# Patient Record
Sex: Female | Born: 1955 | Race: Black or African American | Hispanic: No | Marital: Married
Health system: Midwestern US, Community
[De-identification: ages and names within clinical notes are randomized; demographics above are authoritative.]

## PROBLEM LIST (undated history)

## (undated) DIAGNOSIS — K589 Irritable bowel syndrome without diarrhea: Secondary | ICD-10-CM

## (undated) DIAGNOSIS — C679 Malignant neoplasm of bladder, unspecified: Secondary | ICD-10-CM

## (undated) DIAGNOSIS — I10 Essential (primary) hypertension: Secondary | ICD-10-CM

## (undated) DIAGNOSIS — Z90710 Acquired absence of both cervix and uterus: Secondary | ICD-10-CM

## (undated) DIAGNOSIS — E785 Hyperlipidemia, unspecified: Secondary | ICD-10-CM

## (undated) DIAGNOSIS — E669 Obesity, unspecified: Secondary | ICD-10-CM

## (undated) HISTORY — DX: Essential (primary) hypertension: I10

## (undated) HISTORY — DX: Acquired absence of both cervix and uterus: Z90.710

## (undated) HISTORY — DX: Obesity, unspecified: E66.9

## (undated) HISTORY — PX: BLADDER REMOVAL: SHX567

## (undated) HISTORY — DX: Irritable bowel syndrome, unspecified: K58.9

## (undated) HISTORY — DX: Hyperlipidemia, unspecified: E78.5

---

## 1998-06-26 ENCOUNTER — Other Ambulatory Visit: Admission: RE | Admit: 1998-06-26 | Discharge: 1998-06-26 | Payer: Self-pay | Admitting: Obstetrics and Gynecology

## 2000-03-08 ENCOUNTER — Encounter: Payer: Self-pay | Admitting: Family Medicine

## 2000-03-08 ENCOUNTER — Encounter: Admission: RE | Admit: 2000-03-08 | Discharge: 2000-03-08 | Payer: Self-pay | Admitting: Family Medicine

## 2000-05-27 ENCOUNTER — Other Ambulatory Visit: Admission: RE | Admit: 2000-05-27 | Discharge: 2000-05-27 | Payer: Self-pay | Admitting: Obstetrics and Gynecology

## 2008-04-02 ENCOUNTER — Ambulatory Visit: Payer: Self-pay | Admitting: Family Medicine

## 2008-04-02 ENCOUNTER — Encounter: Admission: RE | Admit: 2008-04-02 | Discharge: 2008-04-02 | Payer: Self-pay | Admitting: Family Medicine

## 2008-04-08 ENCOUNTER — Ambulatory Visit: Payer: Self-pay | Admitting: Family Medicine

## 2008-08-22 ENCOUNTER — Ambulatory Visit: Payer: Self-pay | Admitting: Family Medicine

## 2008-08-28 ENCOUNTER — Encounter: Payer: Self-pay | Admitting: *Deleted

## 2008-11-11 ENCOUNTER — Ambulatory Visit: Payer: Self-pay | Admitting: Family Medicine

## 2008-12-02 ENCOUNTER — Ambulatory Visit: Payer: Self-pay | Admitting: Family Medicine

## 2008-12-13 ENCOUNTER — Ambulatory Visit: Payer: Self-pay | Admitting: Family Medicine

## 2009-01-23 ENCOUNTER — Encounter: Payer: Self-pay | Admitting: *Deleted

## 2009-05-07 ENCOUNTER — Ambulatory Visit: Payer: Self-pay | Admitting: Family Medicine

## 2009-06-25 ENCOUNTER — Ambulatory Visit: Payer: Self-pay | Admitting: Family Medicine

## 2009-10-28 IMAGING — CR DG CERVICAL SPINE COMPLETE 4+V
6 series · 6 of 6 positions shown · non-contrast
Comparison: None

CLINICAL DATA: Neck pain with left arm pain and bilateral extremity
tingling.

CERVICAL SPINE - COMPLETE 4+ VIEW

[view not recorded (1 of 6)]
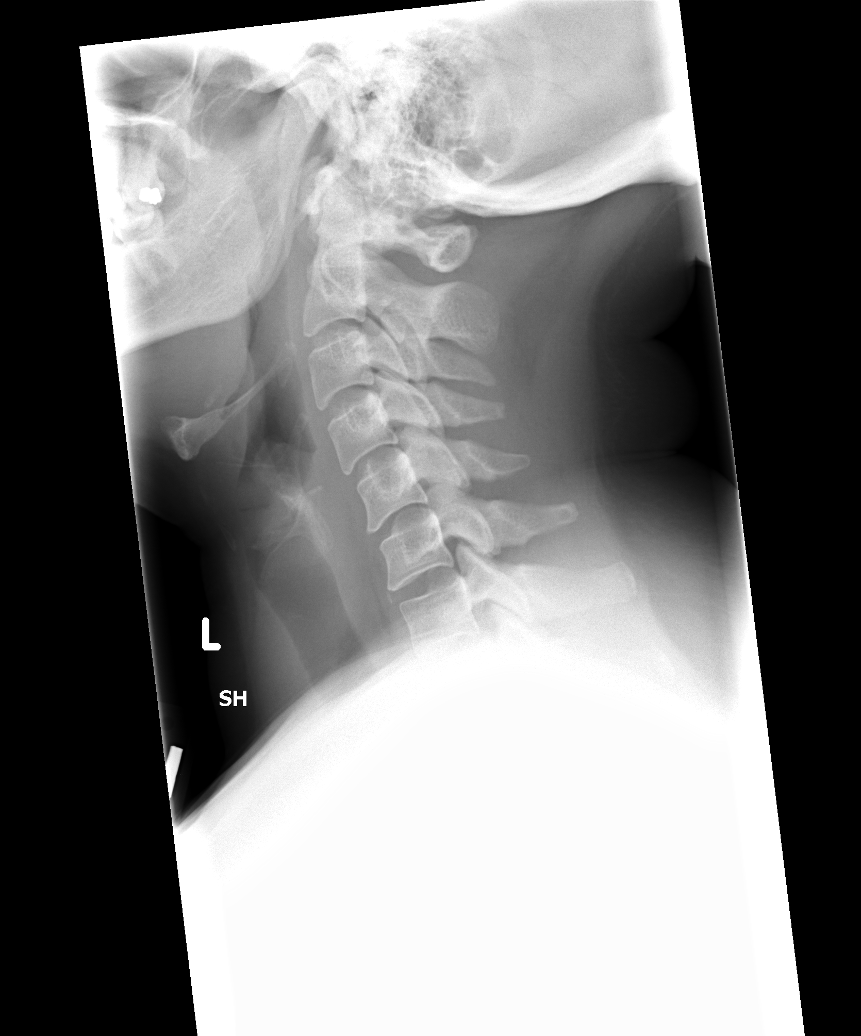

[view not recorded (2 of 6)]
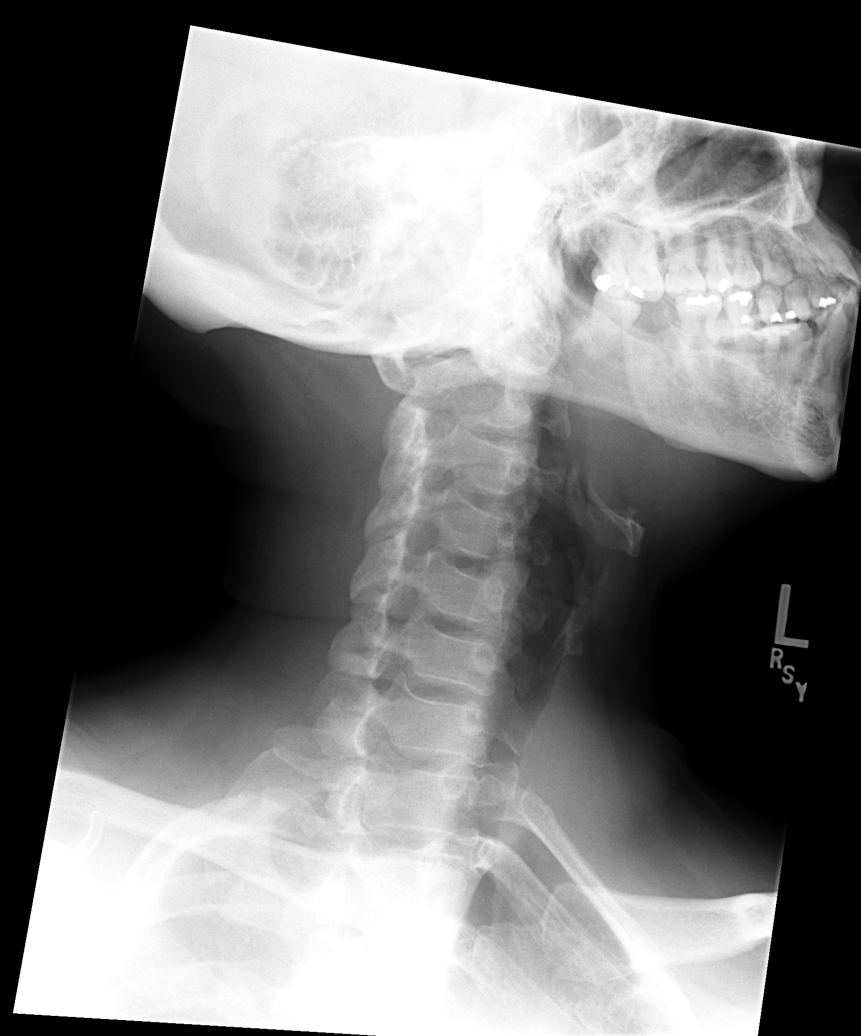

[view not recorded (3 of 6)]
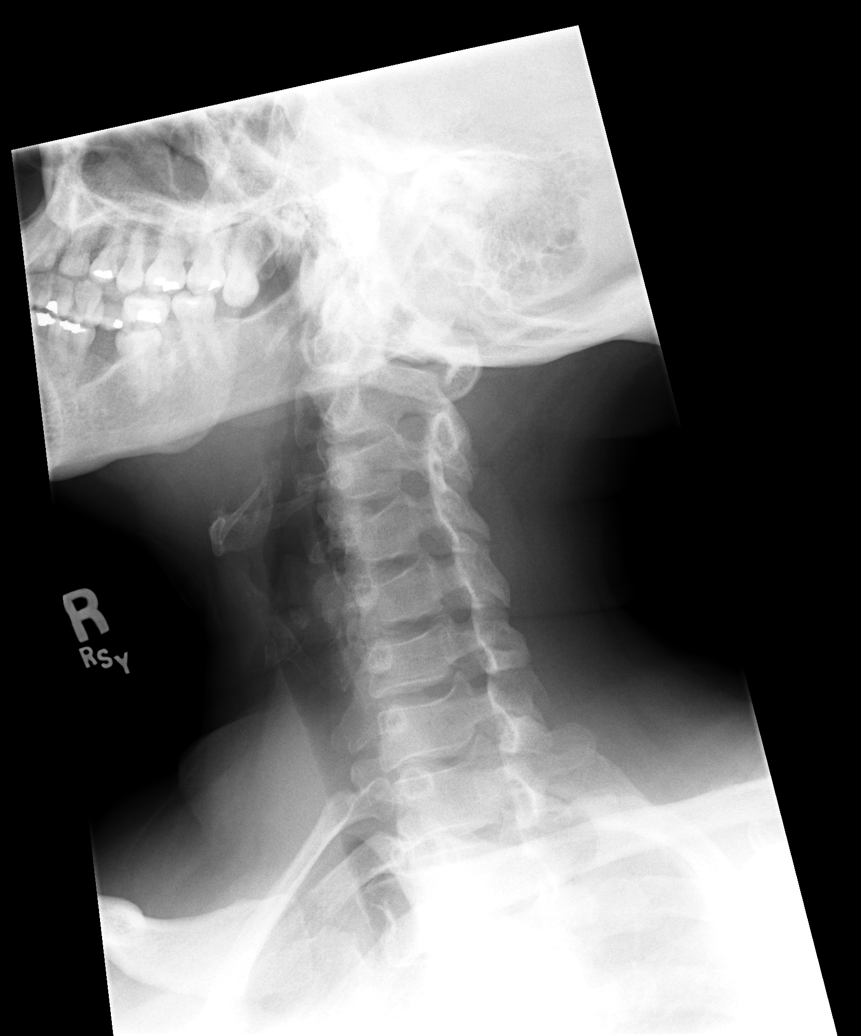

[view not recorded (4 of 6)]
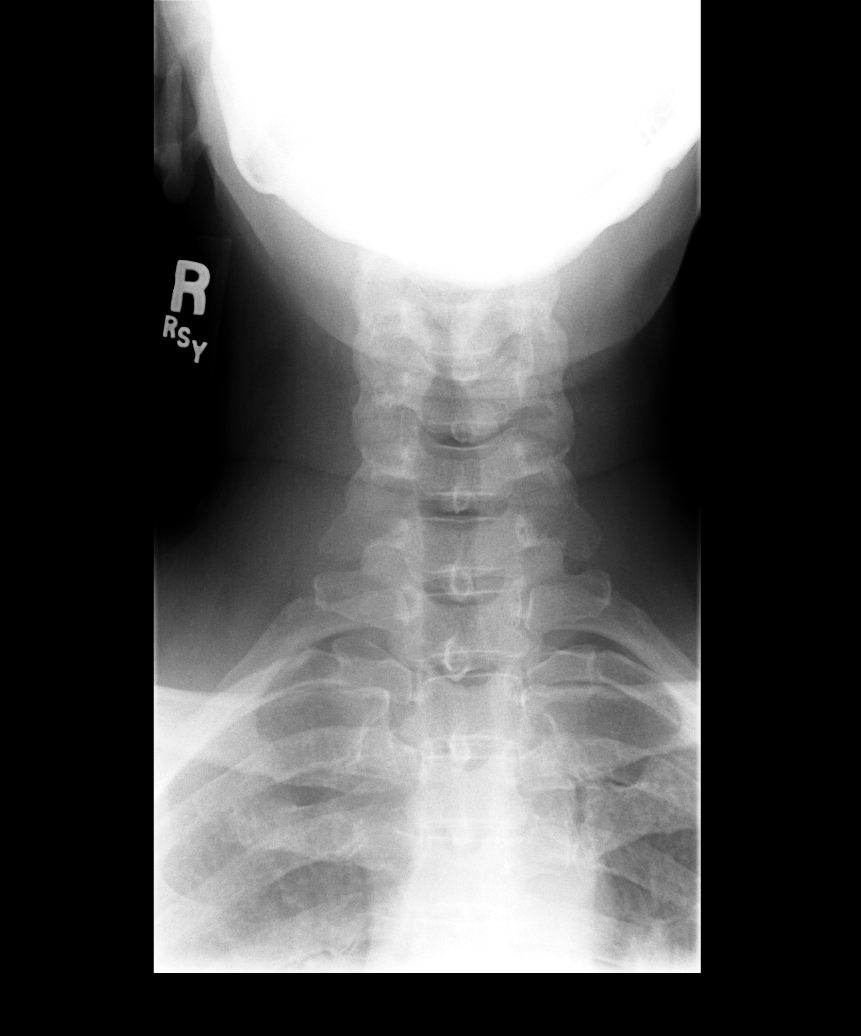

[view not recorded (5 of 6)]
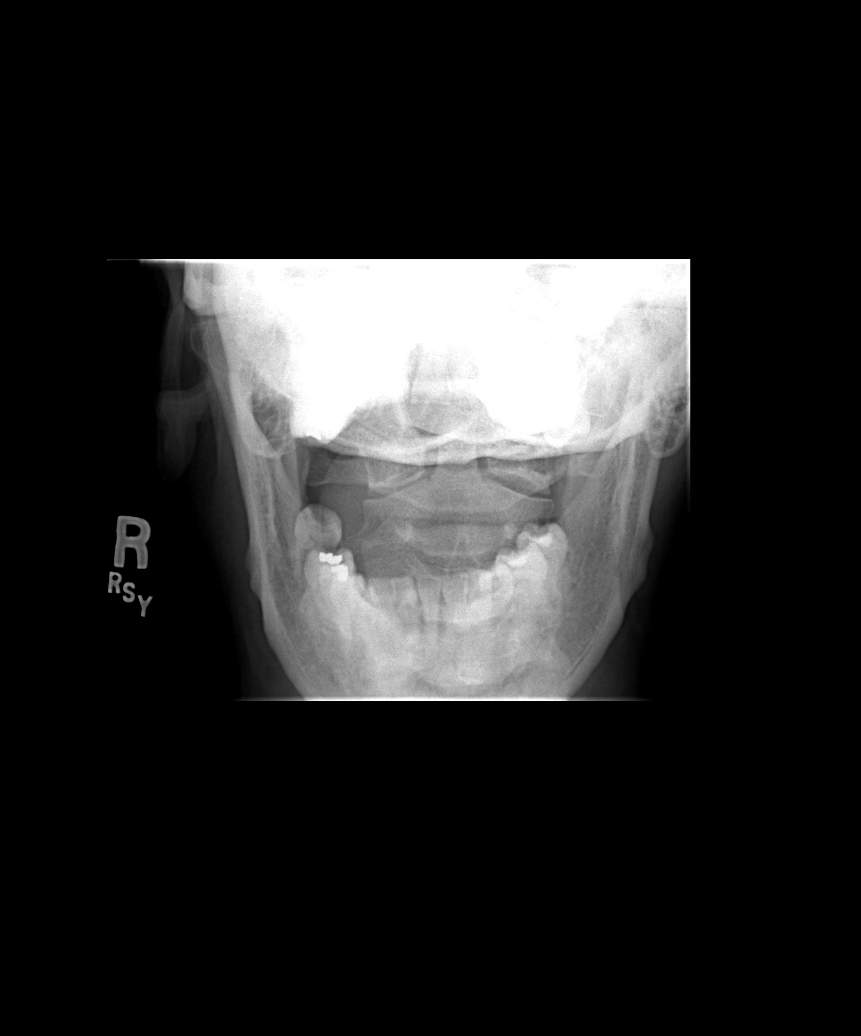

[view not recorded (6 of 6)]
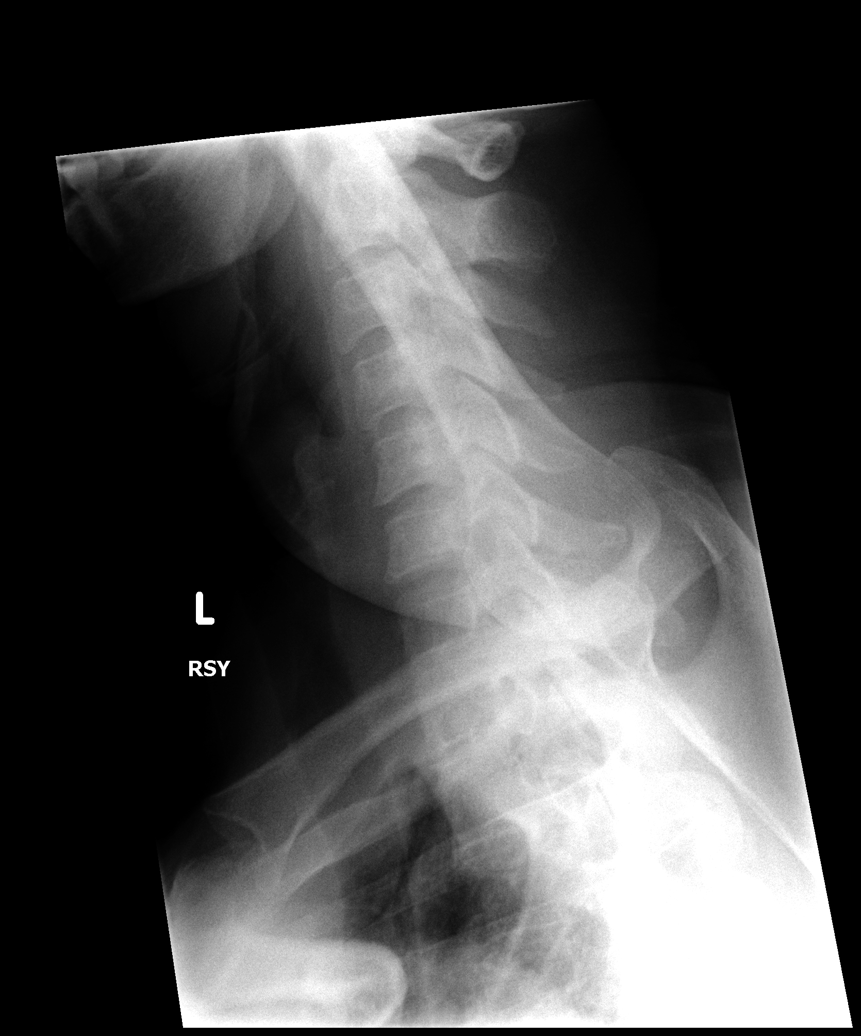

[6 of 6 positions shown; findings below may reference images not displayed]

FINDINGS: The prevertebral soft tissues are normal.  There is mild
straightening of the usual cervical lordosis without focal
angulation or listhesis.  The cervical disc spaces and neural
foramina appear preserved.  The C1-C2 articulation appears normal
in the AP projection.
IMPRESSION: Normal cervical spine radiographs.

## 2009-11-18 ENCOUNTER — Ambulatory Visit: Payer: Self-pay | Admitting: Family Medicine

## 2009-11-19 ENCOUNTER — Emergency Department (HOSPITAL_BASED_OUTPATIENT_CLINIC_OR_DEPARTMENT_OTHER): Admission: EM | Admit: 2009-11-19 | Discharge: 2009-11-20 | Payer: Self-pay | Admitting: Emergency Medicine

## 2009-12-18 ENCOUNTER — Ambulatory Visit: Payer: Self-pay | Admitting: Family Medicine

## 2010-08-23 LAB — BASIC METABOLIC PANEL
BUN: 10 mg/dL (ref 6–23)
CO2: 27 mEq/L (ref 19–32)
Calcium: 9.4 mg/dL (ref 8.4–10.5)
Chloride: 105 mEq/L (ref 96–112)
Creatinine, Ser: 0.7 mg/dL (ref 0.4–1.2)
GFR calc Af Amer: >60 mL/min (ref >60)
GFR calc non Af Amer: >60 mL/min (ref >60)
Glucose, Bld: 122 mg/dL — ABNORMAL HIGH (ref 70–99)
Potassium: 3 mEq/L — ABNORMAL LOW (ref 3.5–5.1)
Sodium: 144 mEq/L (ref 135–145)

## 2010-10-09 LAB — HM MAMMOGRAPHY: HM Mammogram: NEGATIVE

## 2011-01-25 ENCOUNTER — Encounter: Payer: Self-pay | Admitting: Family Medicine

## 2013-10-19 ENCOUNTER — Encounter (HOSPITAL_BASED_OUTPATIENT_CLINIC_OR_DEPARTMENT_OTHER): Payer: Self-pay | Admitting: Emergency Medicine

## 2013-10-19 ENCOUNTER — Emergency Department (HOSPITAL_BASED_OUTPATIENT_CLINIC_OR_DEPARTMENT_OTHER): Payer: BC Managed Care – PPO

## 2013-10-19 ENCOUNTER — Emergency Department (HOSPITAL_BASED_OUTPATIENT_CLINIC_OR_DEPARTMENT_OTHER)
Admission: EM | Admit: 2013-10-19 | Discharge: 2013-10-19 | Disposition: A | Discharge status: Home / Self Care | Payer: BC Managed Care – PPO | Attending: Emergency Medicine | Admitting: Emergency Medicine

## 2013-10-19 DIAGNOSIS — Z90710 Acquired absence of both cervix and uterus: Secondary | ICD-10-CM | POA: Insufficient documentation

## 2013-10-19 DIAGNOSIS — Z8551 Personal history of malignant neoplasm of bladder: Secondary | ICD-10-CM | POA: Insufficient documentation

## 2013-10-19 DIAGNOSIS — E669 Obesity, unspecified: Secondary | ICD-10-CM | POA: Insufficient documentation

## 2013-10-19 DIAGNOSIS — I872 Venous insufficiency (chronic) (peripheral): Secondary | ICD-10-CM | POA: Insufficient documentation

## 2013-10-19 DIAGNOSIS — I10 Essential (primary) hypertension: Secondary | ICD-10-CM | POA: Insufficient documentation

## 2013-10-19 DIAGNOSIS — Z8719 Personal history of other diseases of the digestive system: Secondary | ICD-10-CM | POA: Insufficient documentation

## 2013-10-19 DIAGNOSIS — Z79899 Other long term (current) drug therapy: Secondary | ICD-10-CM | POA: Insufficient documentation

## 2013-10-19 HISTORY — DX: Malignant neoplasm of bladder, unspecified: C67.9

## 2013-10-19 NOTE — ED Notes (Signed)
Pt here to r/u blood clot in (L) leg.

## 2013-10-19 NOTE — Discharge Instructions (Signed)
Your venous duplex was negative for clot. This is likely chronic peripheral venous insufficiency. Raise legs frequently throughout the day, and wear compression stockings. You can also prevent clot by keeping legs moving as we discussed and walking every hour or so on your flight. You can also take aspirin 81mg  the day before, of, and after your flight. If you develop worsened leg swelling, redness, chest pain, or shortness of breath, seek immediate care.

## 2013-10-19 NOTE — ED Provider Notes (Signed)
CSN: [id number]     Arrival date & time 10/19/13  1734 History   First MD Initiated Contact with Patient 10/19/13 1750     Chief Complaint  Patient presents with  . Leg Pain    Patient is a 58 y.o. female presenting with leg pain.  Leg Pain  58 y.o. female with 1 month of intermittent leg swelling and numbness/tingling. She denies leg pain unless she dorsiflexes the leg, at which point she has mild calf dull pain / tightness. Denies dyspnea, chest pain, fever, weakness, or recent immobility. Swelling improves with legs raised overnight. She does have h/o bladder cancer. Her PCP told her to get checked for blood clot in leg prior to her 1.5 hour flight later this week.   Past Medical History  Diagnosis Date  . Hypertension   . Obesity   . Increased serum lipids   . IBS (irritable bowel syndrome)   . H/O: hysterectomy   . Bladder cancer    Past Surgical History  Procedure Laterality Date  . Bladder removal     Family History  Problem Relation Age of Onset  . Hypertension Father   . Hypertension Mother   . Hypertension Brother   . Hypertension Brother   . Diabetes Brother   . Diabetes Brother   . Diabetes Sister   . Hypertension Sister   . Hypertension Sister   . Hypertension Sister    History  Substance Use Topics  . Smoking status: Never Smoker   . Smokeless tobacco: Not on file  . Alcohol Use: Yes     Comment: occasionally   OB History   Grav Para Term Preterm Abortions TAB SAB Ect Mult Living                 Review of Systems  All other systems reviewed and are negative.   Allergies  Codeine  Home Medications   Prior to Admission medications   Medication Sig Start Date End Date Taking? Authorizing Provider  diltiazem (CARDIZEM CD) 240 MG 24 hr capsule Take 240 mg by mouth daily.   Yes Historical Provider, MD  losartan (COZAAR) 100 MG tablet Take 100 mg by mouth daily.   Yes Historical Provider, MD  potassium chloride SA (K-DUR,KLOR-CON) 20 MEQ tablet  Take 40 mEq by mouth 2 (two) times daily.   Yes Historical Provider, MD   BP 127/81  Pulse 78  Temp(Src) 98.1 F (36.7 C) (Oral)  Resp 16  Ht 5\' 4"  (1.626 m)  Wt 220 lb (99.791 kg)  BMI 37.74 kg/m2  SpO2 100% Physical Exam GEN: NAD, pleasant HEENT: Atraumatic, normocephalic, neck supple, EOMI, sclera clear  CV: RRR, no murmurs, rubs, or gallops, 2+ bilateral DP pulses PULM: CTAB, normal effort ABD: Soft, nontender, nondistended, NABS, no organomegaly SKIN: No rash or cyanosis; warm and well-perfused EXTR: No LE edema and left leg not visually larger than right; no erythema. Mild left calf tenderness. Left upper calf 37cm compared to 36cm on right. Left mid-calf 38 cm compared to 36 cm on right. PSYCH: Mood and affect euthymic, normal rate and volume of speech NEURO: Awake, alert, no focal deficits grossly, normal speech   ED Course  Procedures (including critical care time) Labs Review Labs Reviewed - No data to display  Imaging Review US Venous Img Lower Unilateral Left  10/19/2013   CLINICAL DATA:  Left foot swelling for 1 month.  EXAM: Left LOWER EXTREMITY VENOUS DOPPLER ULTRASOUND  TECHNIQUE: Gray-scale sonography with graded compression, as  well as color Doppler and duplex ultrasound were performed to evaluate the lower extremity deep venous systems from the level of the common femoral vein and including the common femoral, femoral, profunda femoral, popliteal and calf veins including the posterior tibial, peroneal and gastrocnemius veins when visible. The superficial great saphenous vein was also interrogated. Spectral Doppler was utilized to evaluate flow at rest and with distal augmentation maneuvers in the common femoral, femoral and popliteal veins.  COMPARISON:  None.  FINDINGS: Common Femoral Vein: No evidence of thrombus. Normal compressibility, respiratory phasicity and response to augmentation.  Saphenofemoral Junction: No evidence of thrombus. Normal compressibility  and flow on color Doppler imaging.  Profunda Femoral Vein: No evidence of thrombus. Normal compressibility and flow on color Doppler imaging.  Femoral Vein: No evidence of thrombus. Normal compressibility, respiratory phasicity and response to augmentation.  Popliteal Vein: No evidence of thrombus. Normal compressibility, respiratory phasicity and response to augmentation.  Calf Veins: No evidence of thrombus. Normal compressibility and flow on color Doppler imaging.  Superficial Great Saphenous Vein: No evidence of thrombus. Normal compressibility and flow on color Doppler imaging.  Venous Reflux:  None.  Other Findings:  None.  IMPRESSION: No evidence of deep venous thrombosis.   Electronically Signed   By: Abelardo Diesel M.D.   On: 10/19/2013 18:52     EKG Interpretation None      MDM   Final diagnoses:  Chronic venous insufficiency   58 y.o. female with h/o bladder cancer here with 1 month of left leg intermittent swelling that improves after keeping legs up overnight. Wells criteria of 1 point. LE venous duplex left negative for DVT. Most likely venous insufficiency. No fever or erythema/induration, making infection unlikely. - Suggested compression stockings. - Walk during flight and take baby aspirin day prior, of, and after flight. - Return precautions reviewed.  Hilton Sinclair, MD PGY-2, [facility]   Hilton Sinclair, MD 10/19/13 [phone]

## 2013-10-21 NOTE — ED Provider Notes (Signed)
I saw and evaluated the patient, reviewed the resident's note and I agree with the findings and plan.   EKG Interpretation None      58 year old female with bilateral leg swelling. Ultrasound negative for DVT. On exam, well appearing, sitting upright in her chair, not distressed, normal respiratory effort, normal perfusion no appreciable edema of either lower extremity, DP pulses intact. Plan outpatient followup.  Clinical Impression: 1. Chronic venous insufficiency       Houston Siren III, MD 10/21/13 0130

## 2015-05-16 IMAGING — US US EXTREM LOW VENOUS*L*
1 series · 13 of 23 positions shown · non-contrast
Comparison: None.

CLINICAL DATA: Left foot swelling for 1 month.



[Series 1: us extrem low venous*left* · 13 of 23 slices shown]
[im 1/23]
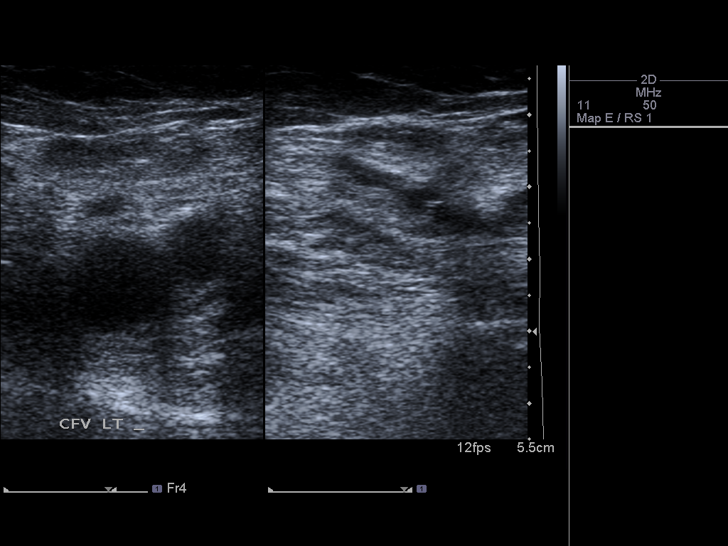
[im 3/23]
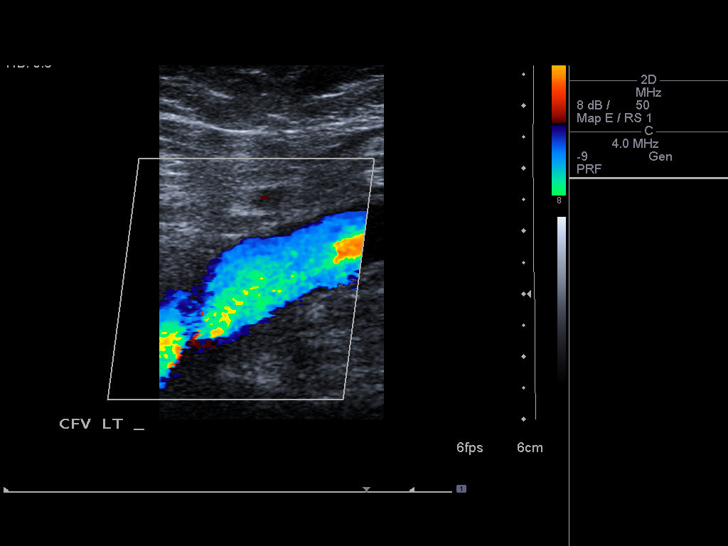
[im 5/23]
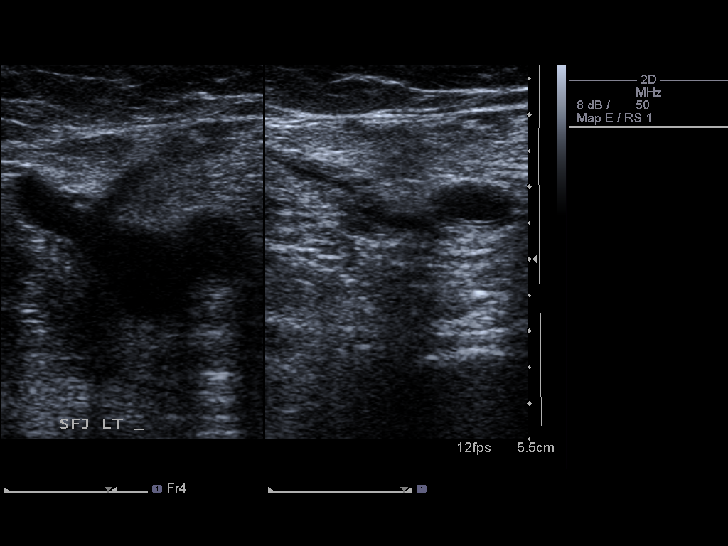
[im 7/23]
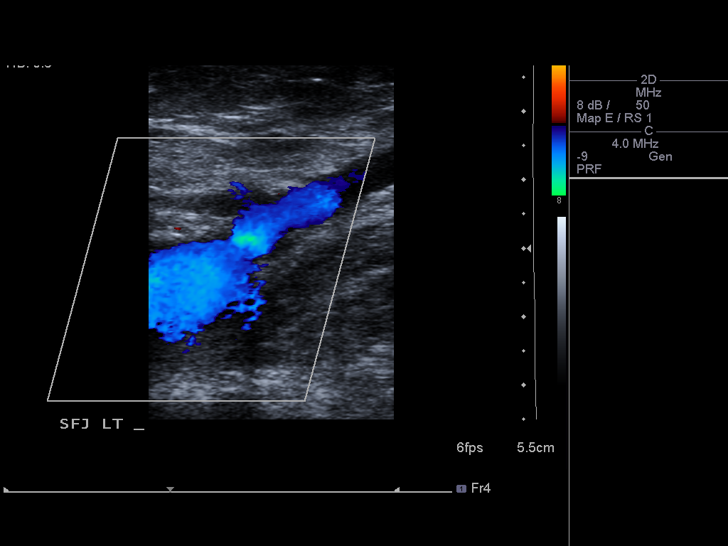
[im 8/23]
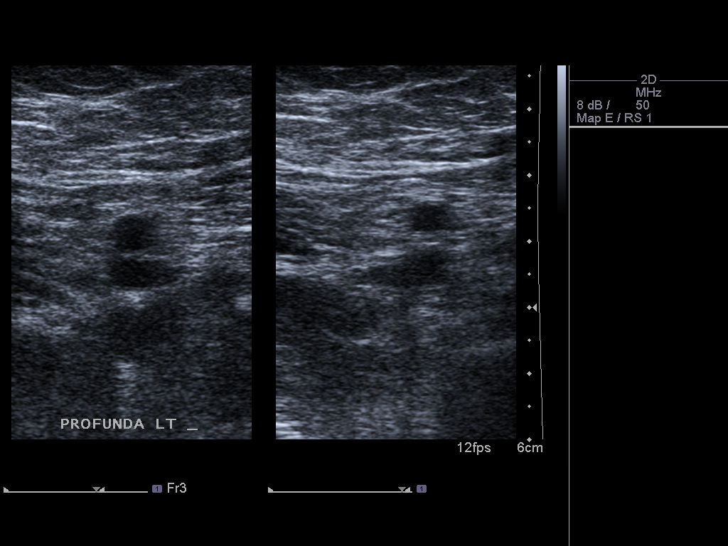
[im 10/23]
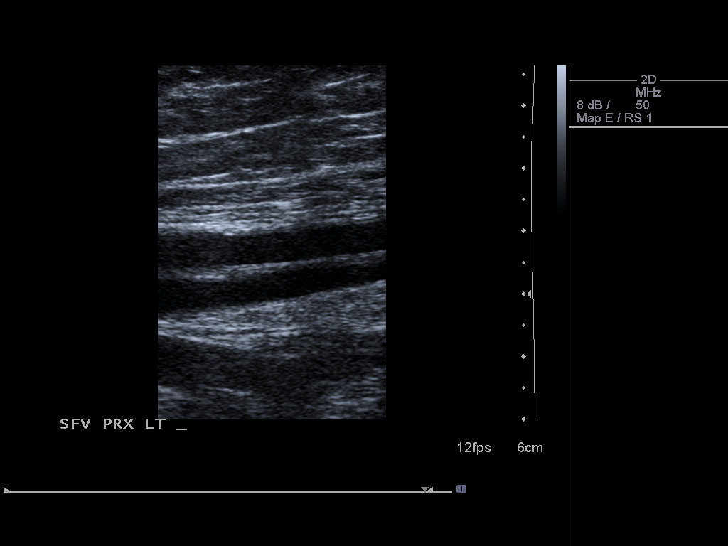
[im 12/23]
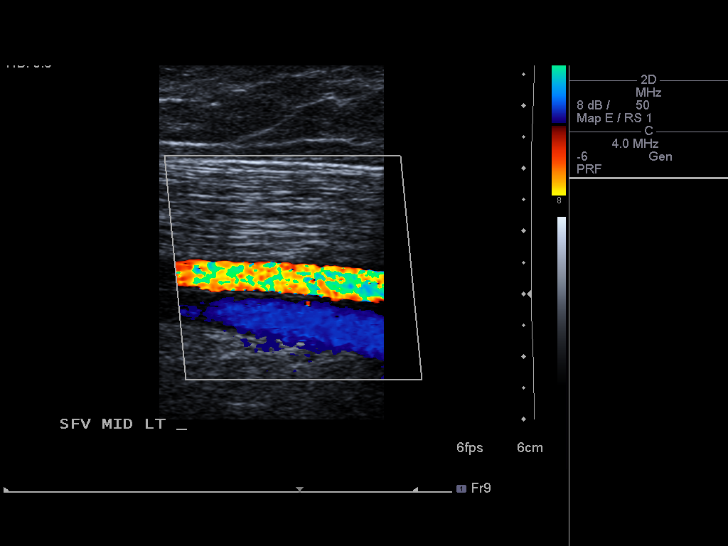
[im 14/23]
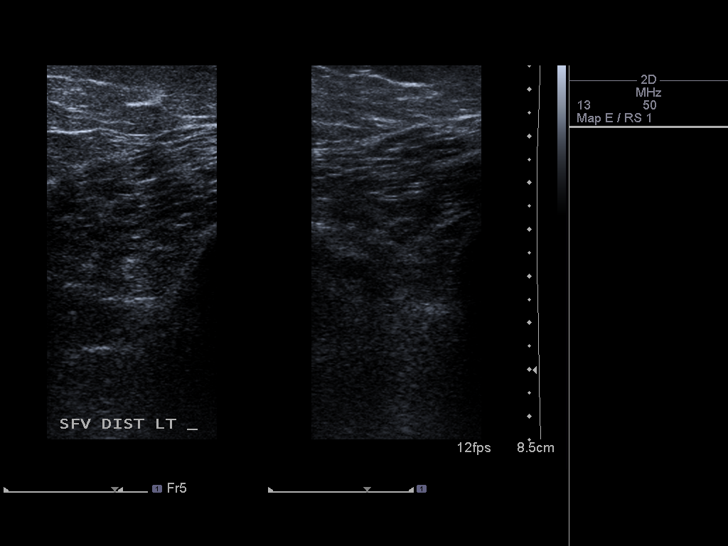
[im 16/23]
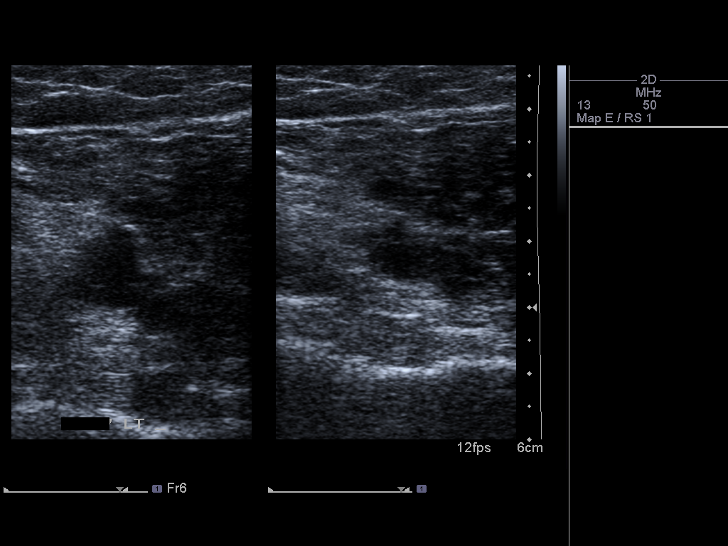
[im 17/23]
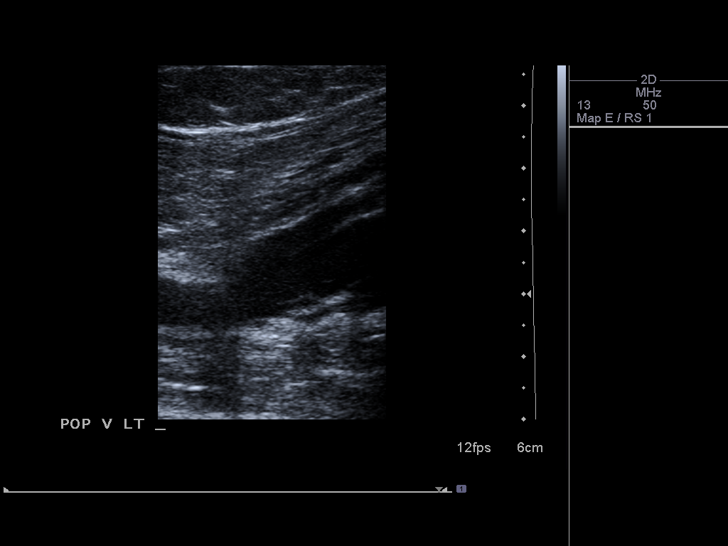
[im 19/23]
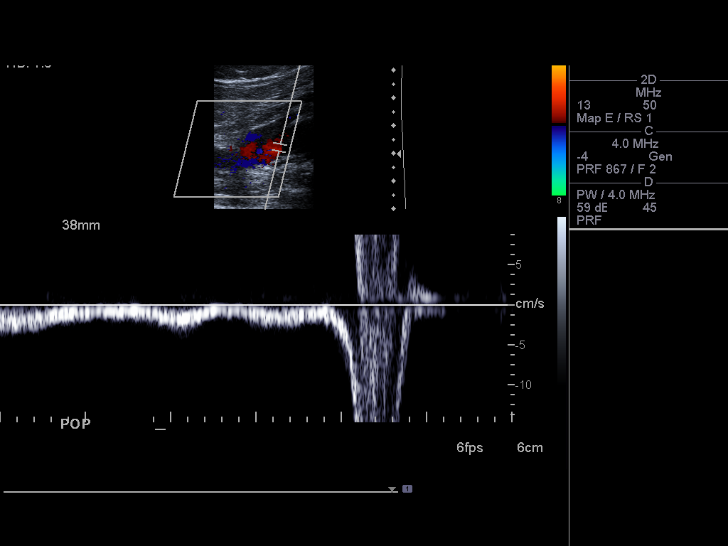
[im 21/23]
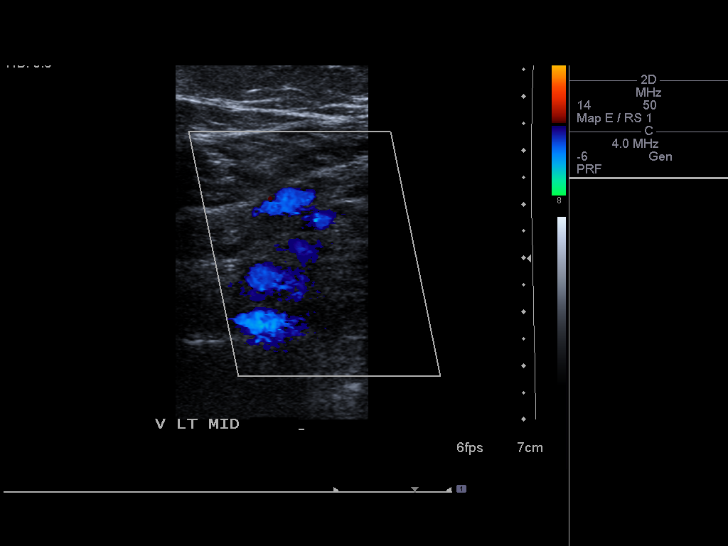
[im 23/23]
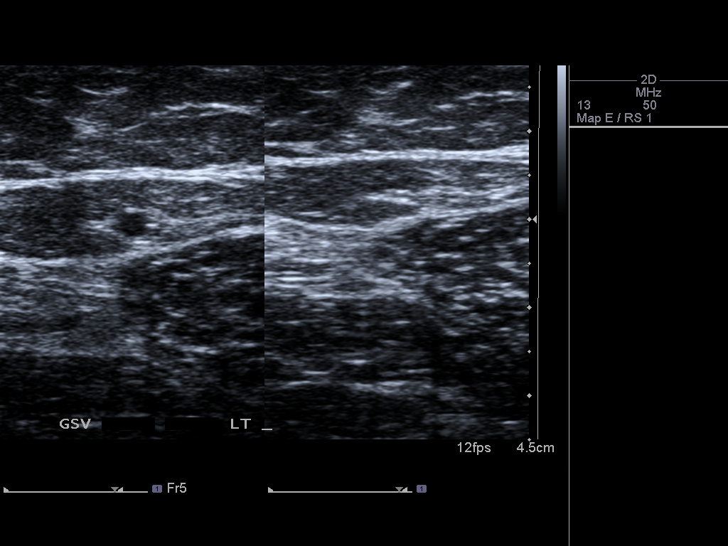

[13 of 23 positions shown; findings below may reference images not displayed]

FINDINGS: Common Femoral Vein: No evidence of thrombus. Normal
compressibility, respiratory phasicity and response to augmentation.

Saphenofemoral Junction: No evidence of thrombus. Normal
compressibility and flow on color Doppler imaging.

Profunda Femoral Vein: No evidence of thrombus. Normal
compressibility and flow on color Doppler imaging.

Femoral Vein: No evidence of thrombus. Normal compressibility,
respiratory phasicity and response to augmentation.

Popliteal Vein: No evidence of thrombus. Normal compressibility,
respiratory phasicity and response to augmentation.

Calf Veins: No evidence of thrombus. Normal compressibility and flow
on color Doppler imaging.

Superficial Great Saphenous Vein: No evidence of thrombus. Normal
compressibility and flow on color Doppler imaging.

Venous Reflux:  None.

Other Findings:  None.
IMPRESSION: No evidence of deep venous thrombosis.

## 2017-01-19 ENCOUNTER — Inpatient Hospital Stay
Admit: 2017-01-19 | Discharge: 2017-01-19 | Disposition: A | Discharge status: Home / Self Care | Payer: BLUE CROSS/BLUE SHIELD | Attending: Emergency Medicine

## 2017-01-19 DIAGNOSIS — Z760 Encounter for issue of repeat prescription: Secondary | ICD-10-CM

## 2017-01-19 NOTE — ED Triage Notes (Signed)
Pt has no physical complaints at this time, [place] run out of urostomy pouches. She wants to register in ER to purchase a pouch to use.

## 2017-01-19 NOTE — ED Provider Notes (Addendum)
HPI Comments: 61 y.o. female presents with need for urostomy pouch supplies. No acute medical complaints. Urine output normal. She is on her last bag and called around to medical supply stores without being able to find them.     The history is provided by the patient.        No past medical history on file.    No past surgical history on file.      No family history on file.    Social History     Social History   ??? Marital status: N/A     Spouse name: N/A   ??? Number of children: N/A   ??? Years of education: N/A     Occupational History   ??? Not on file.     Social History Main Topics   ??? Smoking status: Not on file   ??? Smokeless tobacco: Not on file   ??? Alcohol use Not on file   ??? Drug use: Not on file   ??? Sexual activity: Not on file     Other Topics Concern   ??? Not on file     Social History Narrative         ALLERGIES: Review of patient's allergies indicates no known allergies.    Review of Systems   Constitutional: Negative for chills and fever.   Gastrointestinal: Negative for abdominal pain, constipation, nausea and vomiting.   Genitourinary: Negative for decreased urine volume.   All other systems reviewed and are negative.      There were no vitals filed for this visit.         Physical Exam   Constitutional: She appears well-developed and well-nourished. No distress.   HENT:   Head: Normocephalic and atraumatic.   Eyes: Conjunctivae are normal.   Neck: Neck supple.   Cardiovascular: Normal rate and regular rhythm.    Pulmonary/Chest: Effort normal. No stridor. No respiratory distress.   Abdominal: Soft. She exhibits no distension. There is no tenderness.   Urostomy site c/d/i   Musculoskeletal: Normal range of motion.   Neurological: She is alert. Coordination normal.   Skin: Skin is warm and dry.   Psychiatric: She has a normal mood and affect.   Nursing note and vitals reviewed.       MDM    61 y.o. female presents with need for urostomy supplies. No acute medical  complaints. No dysfunction or signs of infection or other complication. Plan to follow up with PCP as needed and return precautions discussed for worsening or new concerning symptoms.     ED Course       Procedures

## 2023-09-12 ENCOUNTER — Other Ambulatory Visit: Payer: Self-pay

## 2023-09-12 ENCOUNTER — Encounter (HOSPITAL_BASED_OUTPATIENT_CLINIC_OR_DEPARTMENT_OTHER): Payer: Self-pay | Admitting: Emergency Medicine

## 2023-09-12 ENCOUNTER — Emergency Department (HOSPITAL_BASED_OUTPATIENT_CLINIC_OR_DEPARTMENT_OTHER)

## 2023-09-12 ENCOUNTER — Emergency Department (HOSPITAL_BASED_OUTPATIENT_CLINIC_OR_DEPARTMENT_OTHER)
Admission: EM | Admit: 2023-09-12 | Discharge: 2023-09-12 | Disposition: A | Discharge status: Home / Self Care | Attending: Emergency Medicine | Admitting: Emergency Medicine

## 2023-09-12 DIAGNOSIS — M79604 Pain in right leg: Secondary | ICD-10-CM | POA: Insufficient documentation

## 2023-09-12 NOTE — ED Triage Notes (Signed)
 Right leg pain x 2 weeks , started near knee area , pain traveled to lower leg and right foot .  PCP sent her for DVT study

## 2023-09-12 NOTE — ED Provider Notes (Signed)
 [place] [facility] Provider Note   CSN: [id number] Arrival date & time: 09/12/23  1811    History  Chief Complaint  Patient presents with   Leg Pain    Anita Duran is a 68 y.o. female for evaluation of right leg pain.  Started 2-3 weeks ago.  Noted near her posterior lateral right knee into her lower leg and foot.  No numbness or weakness.  No recent falls or injuries.  No swelling or redness.  No chest pain or shortness of breath.  No history of PE or DVT. Hx of ostomy. Sent by PCP for DVT US. No back pain.  HPI     Home Medications Prior to Admission medications   Medication Sig Start Date End Date Taking? Authorizing Provider  diltiazem (CARDIZEM CD) 240 MG 24 hr capsule Take 240 mg by mouth daily.    [provider]  losartan (COZAAR) 100 MG tablet Take 100 mg by mouth daily.    [provider]  potassium chloride SA (K-DUR,KLOR-CON) 20 MEQ tablet Take 40 mEq by mouth 2 (two) times daily.    [provider]      Allergies    Codeine    Review of Systems   Review of Systems  Constitutional: Negative.   HENT: Negative.    Respiratory: Negative.    Cardiovascular: Negative.   Gastrointestinal: Negative.   Genitourinary: Negative.   Musculoskeletal:        Right LE pain  Neurological: Negative.   All other systems reviewed and are negative.   Physical Exam Updated Vital Signs BP 118/68 (BP Location: Left Arm)   Pulse 89   Temp 98.2 F (36.8 C) (Oral)   Resp 16   Wt 97.1 kg   SpO2 98%   BMI 36.73 kg/m  Physical Exam Vitals and nursing note reviewed.  Constitutional:      General: She is not in acute distress.    Appearance: She is well-developed. She is not ill-appearing, toxic-appearing or diaphoretic.  HENT:     Head: Atraumatic.  Eyes:     Pupils: Pupils are equal, round, and reactive to light.  Cardiovascular:     Rate and Rhythm: Normal rate.     Pulses:          Popliteal  pulses are 2+ on the right side.       Posterior tibial pulses are 2+ on the right side.  Pulmonary:     Effort: No respiratory distress.  Abdominal:     General: There is no distension.  Musculoskeletal:        General: Normal range of motion.     Cervical back: Normal range of motion.     Comments: Compartments soft.  No bony tenderness.  Full range of motion.  No overlying erythema or warmth.  No lower extremity swelling.  Does have superficial varicose veins throughout right lower extremity.  Skin:    General: Skin is warm and dry.     Capillary Refill: Capillary refill takes less than 2 seconds.     Comments: No obvious rashes or lesions  Neurological:     General: No focal deficit present.     Mental Status: She is alert.     Comments: Equal strength Intact sensation Ambulatory  Psychiatric:        Mood and Affect: Mood normal.    ED Results / Procedures / Treatments   Labs (all labs ordered are listed, but  only abnormal results are displayed) Labs Reviewed - No data to display  EKG None  Radiology US Venous Img Lower Unilateral Right Result Date: 09/12/2023 CLINICAL DATA:  Right leg pain x2 weeks. EXAM: RIGHT LOWER EXTREMITY VENOUS DOPPLER ULTRASOUND TECHNIQUE: Gray-scale sonography with graded compression, as well as color Doppler and duplex ultrasound were performed to evaluate the lower extremity deep venous systems from the level of the common femoral vein and including the common femoral, femoral, profunda femoral, popliteal and calf veins including the posterior tibial, peroneal and gastrocnemius veins when visible. The superficial great saphenous vein was also interrogated. Spectral Doppler was utilized to evaluate flow at rest and with distal augmentation maneuvers in the common femoral, femoral and popliteal veins. COMPARISON:  None Available. FINDINGS: Contralateral Common Femoral Vein: Respiratory phasicity is normal and symmetric with the symptomatic side. No  evidence of thrombus. Normal compressibility. Common Femoral Vein: No evidence of thrombus. Normal compressibility, respiratory phasicity and response to augmentation. Saphenofemoral Junction: No evidence of thrombus. Normal compressibility and flow on color Doppler imaging. Profunda Femoral Vein: No evidence of thrombus. Normal compressibility and flow on color Doppler imaging. Femoral Vein: No evidence of thrombus. Normal compressibility, respiratory phasicity and response to augmentation. Popliteal Vein: No evidence of thrombus. Normal compressibility, respiratory phasicity and response to augmentation. Calf Veins: No evidence of thrombus. Normal compressibility and flow on color Doppler imaging. Superficial Great Saphenous Vein: No evidence of thrombus. Normal compressibility. Venous Reflux:  None. Other Findings:  None. IMPRESSION: No evidence of deep venous thrombosis within the RIGHT lower extremity. Electronically Signed   By: Aram Candela M.D.   On: 09/12/2023 21:08    Procedures Procedures    Medications Ordered in ED Medications - No data to display  ED Course/ Medical Decision Making/ A&P   68 yo here for evaluation of right lower extremity pain.  Started few weeks ago.  No recent injury or trauma.  No chest pain or shortness of breath.  No history of PE or DVT.  No recent surgery, misuse or malignancy.  Pain started to her posterior lateral aspect of her right leg.  Goes to the lateral aspect of the calf.  Describes it as a muscle cramp.  Seen by PCP and sent here for ultrasound to R/o DVT  Imaging personally viewed and interpreted: [place] neg for DVT   Low suspicion for fracture, dislocation, ischemia, VTE, myositis, rhabdomyolysis, septic joint, gout, hemarthrosis, occult fracture.  Follow-up outpatient, return for any worsening symptoms  The patient has been appropriately medically screened and/or stabilized in the ED. I have low suspicion for any other emergent medical  condition which would require further screening, evaluation or treatment in the ED or require inpatient management.  Patient is hemodynamically stable and in no acute distress.  Patient able to ambulate in department prior to ED.  Evaluation does not show acute pathology that would require ongoing or additional emergent interventions while in the emergency department or further inpatient treatment.  I have discussed the diagnosis with the patient and answered all questions.  Pain is been managed while in the emergency department and patient has no further complaints prior to discharge.  Patient is comfortable with plan discussed in room and is stable for discharge at this time.  I have discussed strict return precautions for returning to the emergency department.  Patient was encouraged to follow-up with PCP/specialist refer to at discharge.  Medical Decision Making Amount and/or Complexity of Data Reviewed Independent Historian: spouse External Data Reviewed: radiology and notes. Radiology: ordered and independent interpretation performed. Decision-making details documented in ED Course.  Risk OTC drugs. Decision regarding hospitalization. Diagnosis or treatment significantly limited by social determinants of health.          Final Clinical Impression(s) / ED Diagnoses Final diagnoses:  Right leg pain    Rx / DC Orders ED Discharge Orders     None         Davon Folta A, PA-C 09/12/23 2136    Anders Simmonds T, DO 09/13/23 1621

## 2023-09-12 NOTE — Discharge Instructions (Signed)
 Your ultrasound here was negative for any blood clot  I would make sure to follow-up outpatient, return for new or worsening symptoms

## 2023-09-12 NOTE — ED Notes (Signed)
 D/c paperwork reviewed with pt, including follow up care.  No questions or concerns voiced at time of d/c. Anita Duran Pt verbalized understanding, Ambulatory with family to ED exit, NAD.
